# Patient Record
Sex: Male | Born: 2000 | Race: Black or African American | Hispanic: No | Marital: Single | Smoking: Never smoker
Health system: Southern US, Community
[De-identification: ages and names within clinical notes are randomized; demographics above are authoritative.]

## PROBLEM LIST (undated history)

## (undated) ENCOUNTER — Ambulatory Visit

## (undated) DIAGNOSIS — F909 Attention-deficit hyperactivity disorder, unspecified type: Secondary | ICD-10-CM

## (undated) DIAGNOSIS — J45909 Unspecified asthma, uncomplicated: Secondary | ICD-10-CM

## (undated) DIAGNOSIS — F902 Attention-deficit hyperactivity disorder, combined type: Secondary | ICD-10-CM

## (undated) HISTORY — DX: Attention-deficit hyperactivity disorder, combined type: F90.2

---
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
## 2018-02-22 ENCOUNTER — Encounter (HOSPITAL_COMMUNITY): Payer: Self-pay | Admitting: Emergency Medicine

## 2018-02-22 ENCOUNTER — Ambulatory Visit (HOSPITAL_COMMUNITY)
Admission: EM | Admit: 2018-02-22 | Discharge: 2018-02-22 | Disposition: A | Discharge status: Home / Self Care | Payer: 59 | Attending: Family Medicine | Admitting: Family Medicine

## 2018-02-22 DIAGNOSIS — J45909 Unspecified asthma, uncomplicated: Secondary | ICD-10-CM | POA: Diagnosis not present

## 2018-02-22 DIAGNOSIS — L2082 Flexural eczema: Secondary | ICD-10-CM | POA: Diagnosis not present

## 2018-02-22 DIAGNOSIS — R300 Dysuria: Secondary | ICD-10-CM

## 2018-02-22 DIAGNOSIS — F909 Attention-deficit hyperactivity disorder, unspecified type: Secondary | ICD-10-CM | POA: Diagnosis not present

## 2018-02-22 DIAGNOSIS — F913 Oppositional defiant disorder: Secondary | ICD-10-CM | POA: Diagnosis not present

## 2018-02-22 DIAGNOSIS — Z79899 Other long term (current) drug therapy: Secondary | ICD-10-CM | POA: Insufficient documentation

## 2018-02-22 LAB — POCT URINALYSIS DIP (DEVICE)
Bilirubin Urine: NEGATIVE
Glucose, UA: NEGATIVE mg/dL
Hgb urine dipstick: NEGATIVE
Ketones, ur: NEGATIVE mg/dL
Leukocytes, UA: NEGATIVE
Nitrite: NEGATIVE
Protein, ur: NEGATIVE mg/dL
Specific Gravity, Urine: 1.02 (ref 1.005–1.030)
Urobilinogen, UA: 1 mg/dL (ref 0.0–1.0)
pH: 7.5 (ref 5.0–8.0)

## 2018-02-22 MED ORDER — AZITHROMYCIN 250 MG PO TABS
1000.0000 mg | ORAL_TABLET | Freq: Once | ORAL | Status: AC
  Administered 2018-02-22: 1000 mg via ORAL

## 2018-02-22 MED ORDER — TRIAMCINOLONE ACETONIDE 0.1 % EX CREA: 1.0000 "application " | TOPICAL_CREAM | Freq: Two times a day (BID) | CUTANEOUS | 0 refills | Status: DC

## 2018-02-22 MED ORDER — AZITHROMYCIN 250 MG PO TABS
ORAL_TABLET | ORAL | Status: AC
  Filled 2018-02-22: qty 4

## 2018-02-22 NOTE — Discharge Instructions (Addendum)
We have treated you today for chlamydia, with azithromycin. Please refrain from sexual activity for 7 days while medicine is clearing infection.  We are testing you for Gonorrhea, Chlamydia and Trichomonas. We will call you if anything is positive and let you know if you require any further treatment. Please inform partner of any positive results.  Please return if symptoms not improving with treatment, development of fever, nausea, vomiting, abdominal pain, scrotal pain.  Eczema: use kenalog cream on dry areas twice daily, thin amount

## 2018-02-22 NOTE — ED Provider Notes (Signed)
[facility]    CSN: [id number] Arrival date & time: 02/22/18  1417     History   Chief Complaint Chief Complaint  Patient presents with  . Dysuria    HPI Adam Espinoza is a 17 y.o. male history of asthma presenting today for evaluation of dysuria.  Patient states that he has had a tingling sensation as well as stinging with urination beginning yesterday.  Patient was here approximately 2 months ago and tested positive for chlamydia and was having similar symptoms to begin.  He does not have any penile discharge at this time.  Patient is still sexually active.  Denies any known exposures to STDs.  Denies any fevers, nausea, vomiting, abdominal pain.  Patient also notes that he has had a rash/dryness to his bilateral flexural areas of his elbow.  As well as his neck.  States he has a history of eczema but has not broken out in a while.  HPI  Past Medical History:  Diagnosis Date  . ADHD (attention deficit hyperactivity disorder)   . ADHD (attention deficit hyperactivity disorder), combined type 01/28/2014  . Asthma     Patient Active Problem List   Diagnosis Date Noted  . ADHD (attention deficit hyperactivity disorder), combined type 01/28/2014  . ODD (oppositional defiant disorder) 01/28/2014    History reviewed. No pertinent surgical history.     Home Medications    Prior to Admission medications   Medication Sig Start Date End Date Taking? Authorizing Provider  ibuprofen (ADVIL,MOTRIN) 800 MG tablet Take 1 tablet (800 mg total) by mouth 3 (three) times daily. 08/23/17   Mardella Layman, MD  ipratropium (ATROVENT) 0.06 % nasal spray Place 2 sprays into both nostrils 4 (four) times daily. 10/30/16   Linna Hoff, MD  triamcinolone cream (KENALOG) 0.1 % Apply 1 application topically 2 (two) times daily. 02/22/18   Wieters, Junius Creamer, PA-C    Family History No family history on file.  Social History Social History   Tobacco Use  . Smoking status:  Never Smoker  . Smokeless tobacco: Never Used  Substance Use Topics  . Alcohol use: No  . Drug use: No     Allergies   Other   Review of Systems Review of Systems  Constitutional: Negative for fever.  HENT: Negative for sore throat.   Respiratory: Negative for shortness of breath.   Cardiovascular: Negative for chest pain.  Gastrointestinal: Negative for abdominal pain, nausea and vomiting.  Genitourinary: Positive for dysuria and frequency. Negative for difficulty urinating, discharge, penile pain, penile swelling, scrotal swelling and testicular pain.  Skin: Positive for rash.  Neurological: Negative for dizziness, light-headedness and headaches.     Physical Exam Triage Vital Signs ED Triage Vitals  Enc Vitals Group     BP 02/22/18 1501 128/90     Pulse Rate 02/22/18 1501 68     Resp 02/22/18 1501 16     Temp 02/22/18 1501 98.6 F (37 C)     Temp Source 02/22/18 1501 Oral     SpO2 02/22/18 1501 100 %     Weight --      Height --      Head Circumference --      Peak Flow --      Pain Score 02/22/18 1517 0     Pain Loc --      Pain Edu? --      Excl. in GC? --    No data found.  Updated Vital Signs  BP 128/90 (BP Location: Right Arm)   Pulse 68   Temp 98.6 F (37 C) (Oral)   Resp 16   SpO2 100%   Visual Acuity Right Eye Distance:   Left Eye Distance:   Bilateral Distance:    Right Eye Near:   Left Eye Near:    Bilateral Near:     Physical Exam  Constitutional: He appears well-developed and well-nourished.  HENT:  Head: Normocephalic and atraumatic.  Eyes: Conjunctivae are normal.  Neck: Neck supple.  Cardiovascular: Normal rate and regular rhythm.  No murmur heard. Pulmonary/Chest: Effort normal and breath sounds normal. No respiratory distress.  Abdominal: Soft. There is no tenderness.  Genitourinary:  Genitourinary Comments: Deferred  Musculoskeletal: He exhibits no edema.  Neurological: He is alert.  Skin: Skin is warm and dry.    Dryness with slight hyperpigmentation/erythema to bilateral Flexeril areas of elbows as well as posterior neck  Psychiatric: He has a normal mood and affect.  Nursing note and vitals reviewed.    UC Treatments / Results  Labs (all labs ordered are listed, but only abnormal results are displayed) Labs Reviewed  POCT URINALYSIS DIP (DEVICE)  URINE CYTOLOGY ANCILLARY ONLY    EKG None  Radiology No results found.  Procedures Procedures (including critical care time)  Medications Ordered in UC Medications  azithromycin (ZITHROMAX) tablet 1,000 mg (1,000 mg Oral Given 02/22/18 1538)    Initial Impression / Assessment and Plan / UC Course  I have reviewed the triage vital signs and the nursing notes.  Pertinent labs & imaging results that were available during my care of the patient were reviewed by me and considered in my medical decision making (see chart for details).     17 year old with dysuria, will empirically treat for chlamydia, declined empiric treatment for gonorrhea.  Urine cytology sent, will call with any positive results and modify treatment as needed.  Patient also with Flexeril eczema, will provide Kenalog cream to apply to problematic areas, discussed moisturizing measures at home. Discussed strict return precautions. Patient verbalized understanding and is agreeable with plan.  Final Clinical Impressions(s) / UC Diagnoses   Final diagnoses:  Dysuria  Flexural eczema     Discharge Instructions     We have treated you today for chlamydia, with azithromycin. Please refrain from sexual activity for 7 days while medicine is clearing infection.  We are testing you for Gonorrhea, Chlamydia and Trichomonas. We will call you if anything is positive and let you know if you require any further treatment. Please inform partner of any positive results.  Please return if symptoms not improving with treatment, development of fever, nausea, vomiting, abdominal pain,  scrotal pain.  Eczema: use kenalog cream on dry areas twice daily, thin amount   ED Prescriptions    Medication Sig Dispense Auth. Provider   triamcinolone cream (KENALOG) 0.1 % Apply 1 application topically 2 (two) times daily. 45 g Wieters, [place] C, PA-C     Controlled Substance Prescriptions [place] Controlled Substance Registry consulted? Not Applicable   Lew Dawes, [place] 02/22/18 1545

## 2018-02-22 NOTE — ED Triage Notes (Signed)
Pt states he was recently treated for chlamydia and symptmos went away, but now it stings when he pees.

## 2018-02-23 LAB — URINE CYTOLOGY ANCILLARY ONLY
Chlamydia: NEGATIVE
Neisseria Gonorrhea: NEGATIVE
Trichomonas: NEGATIVE

## 2018-02-24 NOTE — Progress Notes (Signed)
All std's are negative. Attempted to reach patient with no answer or voicemail set up.

## 2018-04-16 ENCOUNTER — Encounter (HOSPITAL_COMMUNITY): Payer: Self-pay | Admitting: Emergency Medicine

## 2018-04-16 ENCOUNTER — Emergency Department (HOSPITAL_COMMUNITY)
Admission: EM | Admit: 2018-04-16 | Discharge: 2018-04-16 | Disposition: A | Discharge status: Home / Self Care | Payer: 59 | Attending: Emergency Medicine | Admitting: Emergency Medicine

## 2018-04-16 ENCOUNTER — Emergency Department (HOSPITAL_COMMUNITY): Payer: 59

## 2018-04-16 ENCOUNTER — Other Ambulatory Visit: Payer: Self-pay

## 2018-04-16 DIAGNOSIS — S8012XA Contusion of left lower leg, initial encounter: Secondary | ICD-10-CM | POA: Diagnosis not present

## 2018-04-16 DIAGNOSIS — Z79899 Other long term (current) drug therapy: Secondary | ICD-10-CM | POA: Diagnosis not present

## 2018-04-16 DIAGNOSIS — S161XXA Strain of muscle, fascia and tendon at neck level, initial encounter: Secondary | ICD-10-CM | POA: Insufficient documentation

## 2018-04-16 DIAGNOSIS — Y999 Unspecified external cause status: Secondary | ICD-10-CM | POA: Insufficient documentation

## 2018-04-16 DIAGNOSIS — Y9389 Activity, other specified: Secondary | ICD-10-CM | POA: Diagnosis not present

## 2018-04-16 DIAGNOSIS — S8011XA Contusion of right lower leg, initial encounter: Secondary | ICD-10-CM | POA: Diagnosis not present

## 2018-04-16 DIAGNOSIS — S6991XA Unspecified injury of right wrist, hand and finger(s), initial encounter: Secondary | ICD-10-CM | POA: Diagnosis not present

## 2018-04-16 DIAGNOSIS — S3993XA Unspecified injury of pelvis, initial encounter: Secondary | ICD-10-CM | POA: Diagnosis not present

## 2018-04-16 DIAGNOSIS — S298XXA Other specified injuries of thorax, initial encounter: Secondary | ICD-10-CM | POA: Diagnosis not present

## 2018-04-16 DIAGNOSIS — T07XXXA Unspecified multiple injuries, initial encounter: Secondary | ICD-10-CM | POA: Insufficient documentation

## 2018-04-16 DIAGNOSIS — S8991XA Unspecified injury of right lower leg, initial encounter: Secondary | ICD-10-CM | POA: Diagnosis not present

## 2018-04-16 DIAGNOSIS — Y92410 Unspecified street and highway as the place of occurrence of the external cause: Secondary | ICD-10-CM | POA: Diagnosis not present

## 2018-04-16 DIAGNOSIS — S299XXA Unspecified injury of thorax, initial encounter: Secondary | ICD-10-CM | POA: Diagnosis not present

## 2018-04-16 DIAGNOSIS — S199XXA Unspecified injury of neck, initial encounter: Secondary | ICD-10-CM | POA: Diagnosis not present

## 2018-04-16 DIAGNOSIS — M7989 Other specified soft tissue disorders: Secondary | ICD-10-CM | POA: Diagnosis not present

## 2018-04-16 DIAGNOSIS — S060X0A Concussion without loss of consciousness, initial encounter: Secondary | ICD-10-CM | POA: Diagnosis not present

## 2018-04-16 DIAGNOSIS — S0990XA Unspecified injury of head, initial encounter: Secondary | ICD-10-CM | POA: Diagnosis not present

## 2018-04-16 DIAGNOSIS — Z23 Encounter for immunization: Secondary | ICD-10-CM | POA: Insufficient documentation

## 2018-04-16 DIAGNOSIS — S6992XA Unspecified injury of left wrist, hand and finger(s), initial encounter: Secondary | ICD-10-CM | POA: Diagnosis not present

## 2018-04-16 DIAGNOSIS — M79604 Pain in right leg: Secondary | ICD-10-CM | POA: Diagnosis not present

## 2018-04-16 DIAGNOSIS — S3991XA Unspecified injury of abdomen, initial encounter: Secondary | ICD-10-CM | POA: Diagnosis not present

## 2018-04-16 LAB — I-STAT CHEM 8, ED
BUN: 10 mg/dL (ref 6–20)
Calcium, Ion: 1.12 mmol/L — ABNORMAL LOW (ref 1.15–1.40)
Chloride: 105 mmol/L (ref 98–111)
Creatinine, Ser: 1.2 mg/dL (ref 0.61–1.24)
Glucose, Bld: 111 mg/dL — ABNORMAL HIGH (ref 70–99)
HCT: 42 % (ref 39.0–52.0)
Hemoglobin: 14.3 g/dL (ref 13.0–17.0)
Potassium: 3.3 mmol/L — ABNORMAL LOW (ref 3.5–5.1)
Sodium: 144 mmol/L (ref 135–145)
TCO2: 23 mmol/L (ref 22–32)

## 2018-04-16 LAB — COMPREHENSIVE METABOLIC PANEL
ALT: 14 U/L (ref 0–44)
AST: 25 U/L (ref 15–41)
Albumin: 4.1 g/dL (ref 3.5–5.0)
Alkaline Phosphatase: 87 U/L (ref 38–126)
Anion gap: 15 (ref 5–15)
BUN: 9 mg/dL (ref 6–20)
CO2: 24 mmol/L (ref 22–32)
Calcium: 9.4 mg/dL (ref 8.9–10.3)
Chloride: 105 mmol/L (ref 98–111)
Creatinine, Ser: 1.16 mg/dL (ref 0.61–1.24)
GFR calc Af Amer: >60 mL/min (ref >60)
GFR calc non Af Amer: >60 mL/min (ref >60)
Glucose, Bld: 112 mg/dL — ABNORMAL HIGH (ref 70–99)
Potassium: 3.3 mmol/L — ABNORMAL LOW (ref 3.5–5.1)
Sodium: 144 mmol/L (ref 135–145)
Total Bilirubin: 0.7 mg/dL (ref 0.3–1.2)
Total Protein: 6.3 g/dL — ABNORMAL LOW (ref 6.5–8.1)

## 2018-04-16 LAB — PROTIME-INR
INR: 1.05
Prothrombin Time: 13.6 seconds (ref 11.4–15.2)

## 2018-04-16 LAB — CBC
HCT: 43.8 % (ref 39.0–52.0)
Hemoglobin: 14.8 g/dL (ref 13.0–17.0)
MCH: 29.6 pg (ref 26.0–34.0)
MCHC: 33.8 g/dL (ref 30.0–36.0)
MCV: 87.6 fL (ref 78.0–100.0)
Platelets: 344 10*3/uL (ref 150–400)
RBC: 5 MIL/uL (ref 4.22–5.81)
RDW: 12.3 % (ref 11.5–15.5)
WBC: 8.9 10*3/uL (ref 4.0–10.5)

## 2018-04-16 LAB — I-STAT CG4 LACTIC ACID, ED
Lactic Acid, Venous: 1.18 mmol/L (ref 0.5–1.9)
Lactic Acid, Venous: 5.79 mmol/L (ref 0.5–1.9)

## 2018-04-16 LAB — ETHANOL: Alcohol, Ethyl (B): 57 mg/dL — ABNORMAL HIGH (ref <10)

## 2018-04-16 MED ORDER — HYDROCODONE-ACETAMINOPHEN 5-325 MG PO TABS: 1.0000 | ORAL_TABLET | Freq: Four times a day (QID) | ORAL | 0 refills | Status: DC | PRN

## 2018-04-16 MED ORDER — SODIUM CHLORIDE 0.9 % IV SOLN
INTRAVENOUS | Status: AC | PRN
  Administered 2018-04-16: 1000 mL via INTRAVENOUS

## 2018-04-16 MED ORDER — IBUPROFEN 400 MG PO TABS: 400.0000 mg | ORAL_TABLET | Freq: Three times a day (TID) | ORAL | 0 refills | Status: DC | PRN

## 2018-04-16 MED ORDER — ONDANSETRON HCL 4 MG/2ML IJ SOLN
INTRAMUSCULAR | Status: AC | PRN
  Administered 2018-04-16: 4 mg via INTRAVENOUS

## 2018-04-16 MED ORDER — IOHEXOL 300 MG/ML  SOLN
100.0000 mL | Freq: Once | INTRAMUSCULAR | Status: AC | PRN
  Administered 2018-04-16: 100 mL via INTRAVENOUS

## 2018-04-16 MED ORDER — FENTANYL CITRATE (PF) 100 MCG/2ML IJ SOLN
INTRAMUSCULAR | Status: AC | PRN
  Administered 2018-04-16: 50 ug via INTRAVENOUS

## 2018-04-16 MED ORDER — FENTANYL CITRATE (PF) 100 MCG/2ML IJ SOLN
100.0000 ug | Freq: Once | INTRAMUSCULAR | Status: AC
  Administered 2018-04-16: 100 ug via INTRAVENOUS

## 2018-04-16 MED ORDER — SODIUM CHLORIDE 0.9 % IV BOLUS (SEPSIS)
1000.0000 mL | Freq: Once | INTRAVENOUS | Status: AC
  Administered 2018-04-16: 1000 mL via INTRAVENOUS

## 2018-04-16 MED ORDER — TETANUS-DIPHTH-ACELL PERTUSSIS 5-2.5-18.5 LF-MCG/0.5 IM SUSP
0.5000 mL | Freq: Once | INTRAMUSCULAR | Status: AC
  Administered 2018-04-16: 0.5 mL via INTRAMUSCULAR
  Filled 2018-04-16: qty 0.5

## 2018-04-16 MED ORDER — FENTANYL CITRATE (PF) 100 MCG/2ML IJ SOLN
INTRAMUSCULAR | Status: AC | PRN
  Administered 2018-04-16: 100 ug via INTRAVENOUS

## 2018-04-16 MED ORDER — ONDANSETRON HCL 4 MG/2ML IJ SOLN
INTRAMUSCULAR | Status: AC
  Filled 2018-04-16: qty 2

## 2018-04-16 MED ORDER — FENTANYL CITRATE (PF) 100 MCG/2ML IJ SOLN
INTRAMUSCULAR | Status: AC
  Filled 2018-04-16: qty 2

## 2018-04-16 MED ORDER — SODIUM CHLORIDE 0.9 % IV BOLUS (SEPSIS): 1000.0000 mL | Freq: Once | INTRAVENOUS | Status: DC
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
## 2018-04-16 NOTE — ED Notes (Signed)
To CT with RN.

## 2018-04-21 ENCOUNTER — Encounter (HOSPITAL_COMMUNITY): Payer: Self-pay | Admitting: Emergency Medicine

## 2018-04-21 ENCOUNTER — Emergency Department (HOSPITAL_COMMUNITY)
Admission: EM | Admit: 2018-04-21 | Discharge: 2018-04-21 | Disposition: A | Discharge status: Home / Self Care | Payer: 59 | Attending: Emergency Medicine | Admitting: Emergency Medicine

## 2018-04-21 DIAGNOSIS — Y33XXXD Other specified events, undetermined intent, subsequent encounter: Secondary | ICD-10-CM | POA: Diagnosis not present

## 2018-04-21 DIAGNOSIS — T148XXD Other injury of unspecified body region, subsequent encounter: Secondary | ICD-10-CM | POA: Insufficient documentation

## 2018-04-21 DIAGNOSIS — S60512A Abrasion of left hand, initial encounter: Secondary | ICD-10-CM | POA: Diagnosis not present

## 2018-04-21 DIAGNOSIS — S40811A Abrasion of right upper arm, initial encounter: Secondary | ICD-10-CM | POA: Diagnosis not present

## 2018-04-21 DIAGNOSIS — S60511A Abrasion of right hand, initial encounter: Secondary | ICD-10-CM | POA: Diagnosis not present

## 2018-04-21 DIAGNOSIS — S40812A Abrasion of left upper arm, initial encounter: Secondary | ICD-10-CM | POA: Diagnosis not present

## 2018-04-21 DIAGNOSIS — Z5189 Encounter for other specified aftercare: Secondary | ICD-10-CM

## 2018-04-21 MED ORDER — HYDROCODONE-ACETAMINOPHEN 5-325 MG PO TABS: 1.0000 | ORAL_TABLET | Freq: Four times a day (QID) | ORAL | 0 refills | Status: DC | PRN

## 2018-04-21 MED ORDER — METHOCARBAMOL 750 MG PO TABS: 750.0000 mg | ORAL_TABLET | Freq: Four times a day (QID) | ORAL | 0 refills | Status: DC

## 2018-04-21 NOTE — ED Notes (Signed)
Pt and family provided with drink

## 2018-04-21 NOTE — ED Notes (Signed)
MD at bedside. 
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
## 2018-04-21 NOTE — ED Notes (Signed)
Per pt: MD advised to leave wounds undressed and open to air.

## 2018-12-08 DIAGNOSIS — G729 Myopathy, unspecified: Secondary | ICD-10-CM | POA: Insufficient documentation

## 2018-12-08 DIAGNOSIS — F17210 Nicotine dependence, cigarettes, uncomplicated: Secondary | ICD-10-CM | POA: Diagnosis not present

## 2018-12-08 DIAGNOSIS — Z7689 Persons encountering health services in other specified circumstances: Secondary | ICD-10-CM | POA: Insufficient documentation

## 2019-06-07 ENCOUNTER — Other Ambulatory Visit: Payer: Self-pay

## 2019-06-07 ENCOUNTER — Encounter (HOSPITAL_COMMUNITY): Payer: Self-pay

## 2019-06-07 ENCOUNTER — Emergency Department (HOSPITAL_COMMUNITY)
Admission: EM | Admit: 2019-06-07 | Discharge: 2019-06-07 | Disposition: A | Discharge status: Home / Self Care | Payer: 59 | Attending: Emergency Medicine | Admitting: Emergency Medicine

## 2019-06-07 DIAGNOSIS — R109 Unspecified abdominal pain: Secondary | ICD-10-CM | POA: Diagnosis present

## 2019-06-07 DIAGNOSIS — R1110 Vomiting, unspecified: Secondary | ICD-10-CM | POA: Insufficient documentation

## 2019-06-07 DIAGNOSIS — Z5321 Procedure and treatment not carried out due to patient leaving prior to being seen by health care provider: Secondary | ICD-10-CM | POA: Insufficient documentation

## 2019-06-07 DIAGNOSIS — R197 Diarrhea, unspecified: Secondary | ICD-10-CM | POA: Insufficient documentation

## 2019-06-07 LAB — CBC
HCT: 45.2 % (ref 39.0–52.0)
Hemoglobin: 15.1 g/dL (ref 13.0–17.0)
MCH: 29.3 pg (ref 26.0–34.0)
MCHC: 33.4 g/dL (ref 30.0–36.0)
MCV: 87.8 fL (ref 80.0–100.0)
Platelets: 281 10*3/uL (ref 150–400)
RBC: 5.15 MIL/uL (ref 4.22–5.81)
RDW: 12.4 % (ref 11.5–15.5)
WBC: 8.4 10*3/uL (ref 4.0–10.5)
nRBC: 0 % (ref 0.0–0.2)

## 2019-06-07 LAB — COMPREHENSIVE METABOLIC PANEL
ALT: 16 U/L (ref 0–44)
AST: 20 U/L (ref 15–41)
Albumin: 5 g/dL (ref 3.5–5.0)
Alkaline Phosphatase: 95 U/L (ref 38–126)
Anion gap: 8 (ref 5–15)
BUN: 11 mg/dL (ref 6–20)
CO2: 26 mmol/L (ref 22–32)
Calcium: 9.2 mg/dL (ref 8.9–10.3)
Chloride: 103 mmol/L (ref 98–111)
Creatinine, Ser: 0.86 mg/dL (ref 0.61–1.24)
GFR calc Af Amer: >60 mL/min (ref >60)
GFR calc non Af Amer: >60 mL/min (ref >60)
Glucose, Bld: 96 mg/dL (ref 70–99)
Potassium: 3.8 mmol/L (ref 3.5–5.1)
Sodium: 137 mmol/L (ref 135–145)
Total Bilirubin: 1.5 mg/dL — ABNORMAL HIGH (ref 0.3–1.2)
Total Protein: 8 g/dL (ref 6.5–8.1)

## 2019-06-07 LAB — LIPASE, BLOOD: Lipase: 29 U/L (ref 11–51)

## 2019-06-07 MED ORDER — SODIUM CHLORIDE 0.9% FLUSH: 3.0000 mL | Freq: Once | INTRAVENOUS | Status: DC

## 2019-06-07 NOTE — ED Triage Notes (Signed)
Pt states that he has been having abd pain since yesterday. Pt states that he couldn't sleep last night due to emesis and diarrhea every 30 minutes.

## 2020-05-21 ENCOUNTER — Emergency Department (HOSPITAL_COMMUNITY)
Admission: EM | Admit: 2020-05-21 | Discharge: 2020-05-21 | Disposition: A | Discharge status: Home / Self Care | Payer: 59 | Attending: Emergency Medicine | Admitting: Emergency Medicine

## 2020-05-21 ENCOUNTER — Encounter (HOSPITAL_COMMUNITY): Payer: Self-pay | Admitting: Emergency Medicine

## 2020-05-21 ENCOUNTER — Other Ambulatory Visit: Payer: Self-pay

## 2020-05-21 DIAGNOSIS — S01411A Laceration without foreign body of right cheek and temporomandibular area, initial encounter: Secondary | ICD-10-CM | POA: Diagnosis not present

## 2020-05-21 DIAGNOSIS — J45909 Unspecified asthma, uncomplicated: Secondary | ICD-10-CM | POA: Diagnosis not present

## 2020-05-21 DIAGNOSIS — Z23 Encounter for immunization: Secondary | ICD-10-CM | POA: Diagnosis not present

## 2020-05-21 DIAGNOSIS — W25XXXA Contact with sharp glass, initial encounter: Secondary | ICD-10-CM | POA: Diagnosis not present

## 2020-05-21 DIAGNOSIS — F909 Attention-deficit hyperactivity disorder, unspecified type: Secondary | ICD-10-CM | POA: Diagnosis not present

## 2020-05-21 DIAGNOSIS — S0181XA Laceration without foreign body of other part of head, initial encounter: Secondary | ICD-10-CM | POA: Insufficient documentation

## 2020-05-21 DIAGNOSIS — Y929 Unspecified place or not applicable: Secondary | ICD-10-CM | POA: Diagnosis not present

## 2020-05-21 DIAGNOSIS — Y999 Unspecified external cause status: Secondary | ICD-10-CM | POA: Diagnosis not present

## 2020-05-21 DIAGNOSIS — Y939 Activity, unspecified: Secondary | ICD-10-CM | POA: Diagnosis not present

## 2020-05-21 MED ORDER — TETANUS-DIPHTH-ACELL PERTUSSIS 5-2.5-18.5 LF-MCG/0.5 IM SUSP
0.5000 mL | Freq: Once | INTRAMUSCULAR | Status: AC
  Administered 2020-05-21: 0.5 mL via INTRAMUSCULAR
  Filled 2020-05-21: qty 0.5

## 2020-05-21 MED ORDER — LIDOCAINE-EPINEPHRINE (PF) 2 %-1:200000 IJ SOLN
20.0000 mL | Freq: Once | INTRAMUSCULAR | Status: AC
  Administered 2020-05-21: 20 mL
  Filled 2020-05-21: qty 20
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
## 2020-05-30 ENCOUNTER — Ambulatory Visit (HOSPITAL_COMMUNITY)
Admission: EM | Admit: 2020-05-30 | Discharge: 2020-05-30 | Disposition: A | Discharge status: Home / Self Care | Payer: 59

## 2020-05-30 DIAGNOSIS — Z4802 Encounter for removal of sutures: Secondary | ICD-10-CM

## 2020-05-30 NOTE — ED Triage Notes (Signed)
Patient for suture removal.  Sutures placed on 05/21/2020

## 2021-05-25 ENCOUNTER — Other Ambulatory Visit (HOSPITAL_COMMUNITY): Payer: Self-pay

## 2021-05-25 MED ORDER — CARESTART COVID-19 HOME TEST VI KIT
PACK | 0 refills | Status: AC
  Filled 2021-05-25: qty 4, 4d supply, fill #0

## 2021-08-14 ENCOUNTER — Other Ambulatory Visit: Payer: Self-pay

## 2021-08-14 ENCOUNTER — Ambulatory Visit
Admission: EM | Admit: 2021-08-14 | Discharge: 2021-08-14 | Disposition: A | Discharge status: Home / Self Care | Payer: 59 | Attending: Physician Assistant | Admitting: Physician Assistant

## 2021-08-14 ENCOUNTER — Ambulatory Visit (INDEPENDENT_AMBULATORY_CARE_PROVIDER_SITE_OTHER): Payer: 59

## 2021-08-14 DIAGNOSIS — M25511 Pain in right shoulder: Secondary | ICD-10-CM | POA: Diagnosis not present

## 2021-08-14 MED ORDER — CYCLOBENZAPRINE HCL 10 MG PO TABS
10.0000 mg | ORAL_TABLET | Freq: Two times a day (BID) | ORAL | 0 refills | Status: DC | PRN
Start: 2021-08-14 — End: 2022-05-19

## 2021-08-14 MED ORDER — PREDNISONE 20 MG PO TABS: 40.0000 mg | ORAL_TABLET | Freq: Every day | ORAL | 0 refills | Status: AC
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
## 2021-08-14 NOTE — Discharge Instructions (Addendum)
No fracture on xray! Use muscle relaxer with caution. Follow up with any further concerns.

## 2021-08-14 NOTE — ED Triage Notes (Signed)
Pt c/o general body pain, more localized pain being right shoulder, and pain in right lumbar area that is sharp.

## 2022-05-19 ENCOUNTER — Ambulatory Visit
Admission: EM | Admit: 2022-05-19 | Discharge: 2022-05-19 | Disposition: A | Discharge status: Home / Self Care | Payer: 59 | Attending: Physician Assistant | Admitting: Physician Assistant

## 2022-05-19 DIAGNOSIS — M62838 Other muscle spasm: Secondary | ICD-10-CM | POA: Diagnosis not present

## 2022-05-19 LAB — POCT URINALYSIS DIP (MANUAL ENTRY)
Bilirubin, UA: NEGATIVE
Blood, UA: NEGATIVE
Glucose, UA: NEGATIVE mg/dL
Ketones, POC UA: NEGATIVE mg/dL
Leukocytes, UA: NEGATIVE
Nitrite, UA: NEGATIVE
Protein Ur, POC: NEGATIVE mg/dL
Spec Grav, UA: >=1.03 — AB (ref 1.010–1.025)
Urobilinogen, UA: 1 E.U./dL
pH, UA: 6 (ref 5.0–8.0)

## 2022-05-19 MED ORDER — CYCLOBENZAPRINE HCL 10 MG PO TABS: 10.0000 mg | ORAL_TABLET | Freq: Three times a day (TID) | ORAL | 0 refills | Status: AC | PRN

## 2022-05-19 MED ORDER — METHOCARBAMOL 750 MG PO TABS: 750.0000 mg | ORAL_TABLET | Freq: Four times a day (QID) | ORAL | 0 refills | Status: DC

## 2022-05-19 NOTE — ED Provider Notes (Signed)
[facility]    CSN: [id number] Arrival date & time: 05/19/22  1328      History   Chief Complaint Chief Complaint  Patient presents with   Flank Pain   Dysuria    HPI Adam Espinoza is a 21 y.o. male.   Pt complains of left lower back pain that started about three days ago.  He reports pain is worse with movement and he has experienced some left back pain when urinating.  He denies dysuria, hematuria, penile discharge, fever, chills, n/v/d.  He has taken nothing for the pain.     Past Medical History:  Diagnosis Date   ADHD (attention deficit hyperactivity disorder)    ADHD (attention deficit hyperactivity disorder), combined type 01/28/2014   Asthma     Patient Active Problem List   Diagnosis Date Noted   ADHD (attention deficit hyperactivity disorder), combined type 01/28/2014   ODD (oppositional defiant disorder) 01/28/2014    No past surgical history on file.     Home Medications    Prior to Admission medications   Medication Sig Start Date End Date Taking? Authorizing Provider  cyclobenzaprine (FLEXERIL) 10 MG tablet Take 1 tablet (10 mg total) by mouth 3 (three) times daily as needed for up to 7 days for muscle spasms. 05/19/22 05/26/22 Yes Ward, Tylene Fantasia, PA-C  acetaminophen (TYLENOL) 325 MG tablet Take 325-650 mg by mouth every 6 (six) hours as needed (for pain or headaches).    [provider]  COVID-19 At Home Antigen Test [facility] COVID-19 HOME TEST) KIT Use as Directed 05/25/21   Driscilla Grammes, [facility]  HYDROcodone-acetaminophen (NORCO/VICODIN) 5-325 MG tablet Take 1 tablet by mouth every 6 (six) hours as needed for severe pain. 04/16/18   Zadie Rhine, MD  HYDROcodone-acetaminophen (NORCO/VICODIN) 5-325 MG tablet Take 1-2 tablets by mouth every 6 (six) hours as needed. 04/21/18   Lorre Nick, MD  ibuprofen (ADVIL,MOTRIN) 400 MG tablet Take 1 tablet (400 mg total) by mouth every 8 (eight) hours as needed for moderate  pain. Patient not taking: Reported on 04/21/2018 04/16/18   Zadie Rhine, MD    Family History No family history on file.  Social History Social History   Tobacco Use   Smoking status: Never   Smokeless tobacco: Never  Substance Use Topics   Alcohol use: Yes   Drug use: No     Allergies   Nickel and Other   Review of Systems Review of Systems  Constitutional:  Negative for chills and fever.  HENT:  Negative for ear pain and sore throat.   Eyes:  Negative for pain and visual disturbance.  Respiratory:  Negative for cough and shortness of breath.   Cardiovascular:  Negative for chest pain and palpitations.  Gastrointestinal:  Negative for abdominal pain and vomiting.  Genitourinary:  Negative for dysuria and hematuria.  Musculoskeletal:  Positive for back pain. Negative for arthralgias.  Skin:  Negative for color change and rash.  Neurological:  Negative for seizures and syncope.  All other systems reviewed and are negative.    Physical Exam Triage Vital Signs ED Triage Vitals  Enc Vitals Group     BP 05/19/22 1359 124/80     Pulse Rate 05/19/22 1359 77     Resp 05/19/22 1359 20     Temp 05/19/22 1359 98.3 F (36.8 C)     Temp src --      SpO2 05/19/22 1359 98 %     Weight --  Height --      Head Circumference --      Peak Flow --      Pain Score 05/19/22 1358 0     Pain Loc --      Pain Edu? --      Excl. in [place]? --    No data found.  Updated Vital Signs BP 124/80   Pulse 77   Temp 98.3 F (36.8 C)   Resp 20   SpO2 98%   Visual Acuity Right Eye Distance:   Left Eye Distance:   Bilateral Distance:    Right Eye Near:   Left Eye Near:    Bilateral Near:     Physical Exam Vitals and nursing note reviewed.  Constitutional:      General: He is not in acute distress.    Appearance: He is well-developed.  HENT:     Head: Normocephalic and atraumatic.  Eyes:     Conjunctiva/sclera: Conjunctivae normal.  Cardiovascular:     Rate and  Rhythm: Normal rate and regular rhythm.     Heart sounds: No murmur heard. Pulmonary:     Effort: Pulmonary effort is normal. No respiratory distress.     Breath sounds: Normal breath sounds.  Abdominal:     Palpations: Abdomen is soft.     Tenderness: There is no abdominal tenderness.  Musculoskeletal:        General: No swelling.     Cervical back: Neck supple.     Comments: Left lumbar paraspinal musculature TTP with spasm.  No CVA tenderness.   Skin:    General: Skin is warm and dry.     Capillary Refill: Capillary refill takes less than 2 seconds.  Neurological:     Mental Status: He is alert.  Psychiatric:        Mood and Affect: Mood normal.      UC Treatments / Results  Labs (all labs ordered are listed, but only abnormal results are displayed) Labs Reviewed  POCT URINALYSIS DIP (MANUAL ENTRY) - Abnormal; Notable for the following components:      Result Value   Spec Grav, UA >=1.030 (*)    All other components within normal limits    EKG   Radiology No results found.  Procedures Procedures (including critical care time)  Medications Ordered in UC Medications - No data to display  Initial Impression / Assessment and Plan / UC Course  I have reviewed the triage vital signs and the nursing notes.  Pertinent labs & imaging results that were available during my care of the patient were reviewed by me and considered in my medical decision making (see chart for details).     Muscle spasm.  No dysuria, no CVA tenderness, UA with no signs of infection.  Flexeril prescribed.  Supportive care discussed.  Return precautions discussed.  Final Clinical Impressions(s) / UC Diagnoses   Final diagnoses:  Muscle spasm     Discharge Instructions      Take flexeril as needed for muscle spasm If you develop worsening sx return for evaluation  Drink plenty of fluids    ED Prescriptions     Medication Sig Dispense Auth. Provider   cyclobenzaprine (FLEXERIL) 10  MG tablet Take 1 tablet (10 mg total) by mouth 3 (three) times daily as needed for up to 7 days for muscle spasms. 21 tablet Ward, Lenise Arena, PA-C   methocarbamol (ROBAXIN-750) 750 MG tablet  (Status: Discontinued) Take 1 tablet (750 mg total) by mouth  4 (four) times daily. 30 tablet Ward, Lenise Arena, PA-C      PDMP not reviewed this encounter.   Ward, Lenise Arena, PA-C 05/19/22 1423

## 2022-05-19 NOTE — ED Triage Notes (Signed)
Pt presents to uc with co of flank pain and dysuria L sided for 3 days. Pt reports no concern for sti no discharge or rash

## 2022-05-19 NOTE — Discharge Instructions (Addendum)
Take flexeril as needed for muscle spasm If you develop worsening sx return for evaluation  Drink plenty of fluids
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
## 2023-02-02 ENCOUNTER — Emergency Department (HOSPITAL_BASED_OUTPATIENT_CLINIC_OR_DEPARTMENT_OTHER)
Admission: EM | Admit: 2023-02-02 | Discharge: 2023-02-02 | Disposition: A | Discharge status: Home / Self Care | Payer: Commercial Managed Care - PPO | Attending: Emergency Medicine | Admitting: Emergency Medicine

## 2023-02-02 ENCOUNTER — Encounter (HOSPITAL_BASED_OUTPATIENT_CLINIC_OR_DEPARTMENT_OTHER): Payer: Self-pay

## 2023-02-02 DIAGNOSIS — R300 Dysuria: Secondary | ICD-10-CM | POA: Insufficient documentation

## 2023-02-02 DIAGNOSIS — H5789 Other specified disorders of eye and adnexa: Secondary | ICD-10-CM | POA: Insufficient documentation

## 2023-02-02 LAB — URINALYSIS, ROUTINE W REFLEX MICROSCOPIC
Bilirubin Urine: NEGATIVE
Glucose, UA: NEGATIVE mg/dL
Ketones, ur: NEGATIVE mg/dL
Nitrite: NEGATIVE
Protein, ur: NEGATIVE mg/dL
Specific Gravity, Urine: 1.025 (ref 1.005–1.030)
pH: 6 (ref 5.0–8.0)

## 2023-02-02 LAB — URINALYSIS, MICROSCOPIC (REFLEX)

## 2023-02-02 MED ORDER — ERYTHROMYCIN 5 MG/GM OP OINT: TOPICAL_OINTMENT | OPHTHALMIC | 0 refills | Status: DC

## 2023-02-02 MED ORDER — DOXYCYCLINE HYCLATE 100 MG PO CAPS: 100.0000 mg | ORAL_CAPSULE | Freq: Two times a day (BID) | ORAL | 0 refills | Status: DC

## 2023-02-02 MED ORDER — LIDOCAINE HCL (PF) 1 % IJ SOLN
INTRAMUSCULAR | Status: AC
  Filled 2023-02-02: qty 5

## 2023-02-02 MED ORDER — DOXYCYCLINE HYCLATE 100 MG PO TABS
100.0000 mg | ORAL_TABLET | Freq: Once | ORAL | Status: AC
  Administered 2023-02-02: 100 mg via ORAL
  Filled 2023-02-02: qty 1

## 2023-02-02 MED ORDER — FLUORESCEIN SODIUM 1 MG OP STRP
1.0000 | ORAL_STRIP | Freq: Once | OPHTHALMIC | Status: AC
  Administered 2023-02-02: 1 via OPHTHALMIC
  Filled 2023-02-02: qty 1

## 2023-02-02 MED ORDER — CEFTRIAXONE SODIUM 500 MG IJ SOLR
500.0000 mg | Freq: Once | INTRAMUSCULAR | Status: AC
  Administered 2023-02-02: 500 mg via INTRAMUSCULAR
  Filled 2023-02-02: qty 500

## 2023-02-02 NOTE — Discharge Instructions (Addendum)
As discussed in the room we have started you on multiple antibiotics.  Erythromycin eye ointment for possible pinkeye. You will need to call an ophthalmologist tomorrow to get in to be seen over the next 1-2 days.  Dr. Vanessa Barbara is the on-call ophthalmologist.  Please call his office tomorrow to schedule an appointment.  Let them know you are seen in the emergency department and needed close follow-up  We have also treated you for possible STD or urinary tract infection.  You will be called if your STD testing is positive.  Abstain from sexual intercourse until your test have resulted.  If positive you need to let your partners know so they may be tested and treated as well.  If your testing is positive you will need to abstain from intercourse for at least 1 week  Follow-up with a primary care doctor  Return for new or worsening symptoms

## 2023-02-02 NOTE — ED Provider Notes (Signed)
[place] [facility] Provider Note   CSN: [id number] Arrival date & time: 02/02/23  2200     History  Chief Complaint  Patient presents with   Eye Pain    Adam Espinoza is a 22 y.o. male with no significant past medical history here for evaluation of several complaints.  Has noted some dysuria as well as discharge from his penis.  No lesion.  Sexually active.  History of prior chlamydia.  No abdominal pain, pain with bowel movements, testicular pain.  Also noted over the last 3 days he has had itchy, clear watery drainage from his left eye.  He does not wear contacts.  He is not followed by ophthalmology.  Denies any chance at bodily fluid/STD in his eye.  No recent trauma or injury.  No blurred vision.  No pain with eye movements.  No swelling or redness to the face or surrounding orbits.  HPI     Home Medications Prior to Admission medications   Medication Sig Start Date End Date Taking? Authorizing Provider  doxycycline (VIBRAMYCIN) 100 MG capsule Take 1 capsule (100 mg total) by mouth 2 (two) times daily. 02/02/23  Yes Erasto Sleight A, PA-C  erythromycin ophthalmic ointment Place a 1/2 inch ribbon of ointment into the lower eyelid. 02/02/23  Yes Josselyn Harkins A, PA-C  acetaminophen (TYLENOL) 325 MG tablet Take 325-650 mg by mouth every 6 (six) hours as needed (for pain or headaches).    [provider]  COVID-19 At Home Antigen Test [facility] COVID-19 HOME TEST) KIT Use as Directed 05/25/21   Driscilla Grammes, [facility]  HYDROcodone-acetaminophen (NORCO/VICODIN) 5-325 MG tablet Take 1 tablet by mouth every 6 (six) hours as needed for severe pain. 04/16/18   Zadie Rhine, MD  HYDROcodone-acetaminophen (NORCO/VICODIN) 5-325 MG tablet Take 1-2 tablets by mouth every 6 (six) hours as needed. 04/21/18   Lorre Nick, MD  ibuprofen (ADVIL,MOTRIN) 400 MG tablet Take 1 tablet (400 mg total) by mouth every 8 (eight) hours as needed  for moderate pain. Patient not taking: Reported on 04/21/2018 04/16/18   Zadie Rhine, MD      Allergies    Nickel and Other    Review of Systems   Review of Systems  Constitutional: Negative.   HENT: Negative.    Eyes:  Positive for pain, discharge, redness and itching.  Respiratory: Negative.    Cardiovascular: Negative.   Gastrointestinal: Negative.   Genitourinary:  Positive for dysuria and penile discharge.  Musculoskeletal: Negative.   Skin: Negative.   Neurological: Negative.   All other systems reviewed and are negative.   Physical Exam Updated Vital Signs BP 138/78 (BP Location: Left Arm)   Pulse 90   Temp 98.2 F (36.8 C) (Oral)   Resp 18   Ht  (1.676 m)   Wt 94.3 kg   SpO2 100%   BMI 33.57 kg/m  Physical Exam Vitals and nursing note reviewed.  Constitutional:      General: He is not in acute distress.    Appearance: He is well-developed. He is not ill-appearing, toxic-appearing or diaphoretic.  HENT:     Head: Atraumatic.  Eyes:     General: Vision grossly intact.        Right eye: No foreign body, discharge or hordeolum.        Left eye: No foreign body, discharge or hordeolum.     Extraocular Movements: Extraocular movements intact.     Conjunctiva/sclera:  Right eye: Right conjunctiva is not injected. No chemosis, exudate or hemorrhage.    Left eye: Left conjunctiva is injected. No chemosis, exudate or hemorrhage.    Pupils: Pupils are equal, round, and reactive to light.     Comments: Conjunctival injection, clear, watery drainage.  EOMs intact without difficulty or pain.  No surrounding periorbital erythema, warmth. No chemosis.  No fluorescein uptake  Negative Seidel sign  Cardiovascular:     Rate and Rhythm: Normal rate and regular rhythm.  Pulmonary:     Effort: Pulmonary effort is normal. No respiratory distress.  Abdominal:     General: There is no distension.     Palpations: Abdomen is soft.  Musculoskeletal:         General: Normal range of motion.     Cervical back: Normal range of motion and neck supple.  Skin:    General: Skin is warm and dry.  Neurological:     General: No focal deficit present.     Mental Status: He is alert and oriented to person, place, and time.    ED Results / Procedures / Treatments   Labs (all labs ordered are listed, but only abnormal results are displayed) Labs Reviewed  URINALYSIS, ROUTINE W REFLEX MICROSCOPIC  GC/CHLAMYDIA PROBE AMP ([place]) NOT AT [facility]    EKG None  Radiology No results found.  Procedures Procedures    Medications Ordered in ED Medications  cefTRIAXone (ROCEPHIN) injection 500 mg (500 mg Intramuscular Given 02/02/23 2255)  doxycycline (VIBRA-TABS) tablet 100 mg (100 mg Oral Given 02/02/23 2256)  fluorescein ophthalmic strip 1 strip (1 strip Left Eye Given 02/02/23 2250)  lidocaine (PF) (XYLOCAINE) 1 % injection (  Given 02/02/23 2255)   ED Course/ Medical Decision Making/ A&P    22 year old here for evaluation of 2 complaints.  3 days ago noted clear, watery drainage from his left eye.  Some pruritus.  Noted today he had some pain to the area.  No recent injury or trauma.  No blurred vision, diplopia.  No pain with eye movements.  No evidence of periorbital/ orbital cellulitis.  Does have some conjunctival injection without chemosis.  No hyphema.  Negative fluorescein uptake, negative Seidel sign.  No recent traumatic injuries to suggest globe rupture. No recent sick contacts.  Patient also with dysuria and penile discharge.  History of chlamydia.  No abdominal pain, pain with bowel movements, testicular pain, fever.  He is sexually active and does not use protection.  No rashes.  Discussed plan with patient.  Will plan on treating with Rocephin, Doxy.  Will also add on erythromycin eye ointment.  Will have him follow-up with ophthalmology for his eye complaints.  Encouraged abstaining from sexual intercourse until results of STD panels.   Overall his clear watery drainage does not seem suspicious for ocular gonorrhea or chlamydia however will cover with antibiotics and have close follow-up outpatient.  The patient has been appropriately medically screened and/or stabilized in the ED. I have low suspicion for any other emergent medical condition which would require further screening, evaluation or treatment in the ED or require inpatient management.  Patient is hemodynamically stable and in no acute distress.  Patient able to ambulate in department prior to ED.  Evaluation does not show acute pathology that would require ongoing or additional emergent interventions while in the emergency department or further inpatient treatment.  I have discussed the diagnosis with the patient and answered all questions.  Pain is been managed while  in the emergency department and patient has no further complaints prior to discharge.  Patient is comfortable with plan discussed in room and is stable for discharge at this time.  I have discussed strict return precautions for returning to the emergency department.  Patient was encouraged to follow-up with PCP/specialist refer to at discharge.                             Medical Decision Making Amount and/or Complexity of Data Reviewed External Data Reviewed: labs and notes. Labs: ordered. Decision-making details documented in ED Course.  Risk OTC drugs. Prescription drug management. Parenteral controlled substances. Decision regarding hospitalization. Diagnosis or treatment significantly limited by social determinants of health.         Final Clinical Impression(s) / ED Diagnoses Final diagnoses:  Dysuria  Eye drainage    Rx / DC Orders ED Discharge Orders          Ordered    doxycycline (VIBRAMYCIN) 100 MG capsule  2 times daily        02/02/23 2239    erythromycin ophthalmic ointment        02/02/23 2239              Jadrian Bulman A, PA-C 02/02/23 2307     Virgina Norfolk, DO 02/06/23 1512

## 2023-02-02 NOTE — ED Triage Notes (Signed)
Pain and redness in left eye +swelling Also reports pain from penis and noticed white drainage today

## 2023-02-04 LAB — GC/CHLAMYDIA PROBE AMP (~~LOC~~) NOT AT ARMC
Chlamydia: NEGATIVE
Comment: NEGATIVE
Comment: NORMAL
Neisseria Gonorrhea: POSITIVE — AB

## 2024-01-12 ENCOUNTER — Other Ambulatory Visit: Payer: Self-pay

## 2024-01-12 ENCOUNTER — Ambulatory Visit
Admission: EM | Admit: 2024-01-12 | Discharge: 2024-01-12 | Disposition: A | Discharge status: Home / Self Care | Payer: Self-pay | Attending: Family Medicine | Admitting: Family Medicine

## 2024-01-12 ENCOUNTER — Encounter: Payer: Self-pay | Admitting: *Deleted

## 2024-01-12 DIAGNOSIS — R300 Dysuria: Secondary | ICD-10-CM | POA: Insufficient documentation

## 2024-01-12 DIAGNOSIS — R360 Urethral discharge without blood: Secondary | ICD-10-CM | POA: Insufficient documentation

## 2024-01-12 DIAGNOSIS — Z202 Contact with and (suspected) exposure to infections with a predominantly sexual mode of transmission: Secondary | ICD-10-CM | POA: Insufficient documentation

## 2024-01-12 MED ORDER — CEFTRIAXONE SODIUM 500 MG IJ SOLR
500.0000 mg | Freq: Once | INTRAMUSCULAR | Status: AC
  Administered 2024-01-12: 500 mg via INTRAMUSCULAR

## 2024-01-12 NOTE — Discharge Instructions (Signed)
 Cytology results will be available within 24 hours.  Results will update to MyChart , if you have questions related to your results and treatment call the office directly if our clinical results nurse has not already reached out to you.

## 2024-01-12 NOTE — ED Provider Notes (Signed)
 [facility]    CSN: [id number] Arrival date & time: 01/12/24  1139      History   Chief Complaint Chief Complaint  Patient presents with   Penile Discharge    HPI Adam Espinoza is a 23 y.o. male.  Patient here today with acute onset dysuria and normal penile discharge.  Symptoms began early this morning.   He is sexually active with 1 partner who he did reach out to today to see if as experiencing any symptoms. Per patient his partner is getting tested today.   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 No pertinent surgical history.     Home Medications    Prior to Admission medications   Medication Sig Start Date End Date Taking? Authorizing Provider  DULoxetine (CYMBALTA) 20 MG capsule Take 20 mg by mouth daily. 12/11/18  Yes [provider]  lisdexamfetamine (VYVANSE) 30 MG capsule Take 30 mg by mouth daily. 02/08/13  Yes [provider]  acetaminophen (TYLENOL) 325 MG tablet Take 325-650 mg by mouth every 6 (six) hours as needed (for pain or headaches).    [provider]  COVID-19 At Home Antigen Test [facility] COVID-19 HOME TEST) KIT Use as Directed 05/25/21   Driscilla Grammes, [facility]  doxycycline (VIBRAMYCIN) 100 MG capsule Take 1 capsule (100 mg total) by mouth 2 (two) times daily. 02/02/23   Henderly, Britni A, PA-C  erythromycin ophthalmic ointment Place a 1/2 inch ribbon of ointment into the lower eyelid. 02/02/23   Henderly, Britni A, PA-C   HYDROcodone-acetaminophen (NORCO/VICODIN) 5-325 MG tablet Take 1 tablet by mouth every 6 (six) hours as needed for severe pain. 04/16/18   Zadie Rhine, MD  HYDROcodone-acetaminophen (NORCO/VICODIN) 5-325 MG tablet Take 1-2 tablets by mouth every 6 (six) hours as needed. 04/21/18   Lorre Nick, MD  ibuprofen (ADVIL,MOTRIN) 400 MG tablet Take 1 tablet (400 mg total) by mouth every 8 (eight) hours as needed for moderate pain. Patient not taking: Reported on 04/21/2018 04/16/18   Zadie Rhine, MD    Family History History reviewed. No pertinent family history.  Social History Social History   Tobacco Use   Smoking status: Never   Smokeless tobacco: Never  Vaping Use   Vaping status: Never Used  Substance Use Topics   Alcohol use: Yes    Comment: occasional   Drug use: No     Allergies   Nickel and Other   Review of Systems Review of Systems  Genitourinary:  Positive for penile discharge.     Physical Exam Triage Vital Signs ED Triage Vitals  Encounter Vitals Group     BP 01/12/24 1303 109/68     Systolic BP Percentile --      Diastolic BP Percentile --      Pulse Rate 01/12/24 1303 66     Resp 01/12/24 1303 16     Temp 01/12/24 1303 97.9 F (36.6 C)     Temp Source 01/12/24 1303 Oral     SpO2 01/12/24 1303 96 %     Weight --  Height --      Head Circumference --      Peak Flow --      Pain Score 01/12/24 1300 4     Pain Loc --      Pain Education --      Exclude from Growth Chart --    No data found.  Updated Vital Signs BP 109/68 (BP Location: Right Arm)   Pulse 66   Temp 97.9 F (36.6 C) (Oral)   Resp 16   SpO2 96%   Visual Acuity Right Eye Distance:   Left Eye Distance:   Bilateral Distance:    Right Eye Near:   Left Eye Near:    Bilateral Near:     Physical Exam Vitals reviewed.  Constitutional:      Appearance: Normal appearance.  HENT:     Head: Normocephalic and atraumatic.  Cardiovascular:     Rate and Rhythm: Normal  rate and regular rhythm.  Pulmonary:     Effort: Pulmonary effort is normal.     Breath sounds: Normal breath sounds.  Neurological:     General: No focal deficit present.     Mental Status: He is alert and oriented to person, place, and time.   Cytology swab self collected  UC Treatments / Results  Labs (all labs ordered are listed, but only abnormal results are displayed) Labs Reviewed  CYTOLOGY, (ORAL, ANAL, URETHRAL) ANCILLARY ONLY    EKG   Radiology No results found.  Procedures Procedures (including critical care time)  Medications Ordered in UC Medications  cefTRIAXone (ROCEPHIN) injection 500 mg (has no administration in time range)    Initial Impression / Assessment and Plan / UC Course  I have reviewed the triage vital signs and the nursing notes.  Pertinent labs & imaging results that were available during my care of the patient were reviewed by me and considered in my medical decision making (see chart for details).    Abnormal penile discharge without blood, high suspicion for STD covering for Rocephin 500 mg IM while cytology is pending.  Patient advised that even if his cytology results are negative and he learned that his partner has tested positive for any STIs he will need to for treatment.  Patient declined RPR and HIV testing as he reports he was tested somewhere else a month ago, all results were negative per patient.  Written education provided regarding safe sex practices. Pt advised result will be available within 24 hours.  Patient verbalized understanding and agreement with plan. Final Clinical Impressions(s) / UC Diagnoses   Final diagnoses:  Penile discharge, without blood  Dysuria  Encounter for assessment of STD exposure     Discharge Instructions      Cytology results will be available within 24 hours.  Results will update to MyChart , if you have questions related to your results and treatment call the office directly if our clinical  results nurse has not already reached out to you.   ED Prescriptions   None    PDMP not reviewed this encounter.   Bing Neighbors, NP 01/12/24 [phone]

## 2024-01-12 NOTE — ED Triage Notes (Signed)
 Pain with urination and penile discharge starting today

## 2024-01-13 LAB — CYTOLOGY, (ORAL, ANAL, URETHRAL) ANCILLARY ONLY
Chlamydia: NEGATIVE
Comment: NEGATIVE
Comment: NEGATIVE
Comment: NORMAL
Neisseria Gonorrhea: NEGATIVE
Trichomonas: NEGATIVE

## 2024-03-10 ENCOUNTER — Other Ambulatory Visit (HOSPITAL_COMMUNITY): Payer: Self-pay

## 2024-03-10 ENCOUNTER — Other Ambulatory Visit: Payer: Self-pay

## 2024-03-10 ENCOUNTER — Emergency Department (HOSPITAL_BASED_OUTPATIENT_CLINIC_OR_DEPARTMENT_OTHER)
Admission: EM | Admit: 2024-03-10 | Discharge: 2024-03-10 | Disposition: A | Discharge status: Home / Self Care | Payer: Self-pay | Attending: Emergency Medicine | Admitting: Emergency Medicine

## 2024-03-10 ENCOUNTER — Encounter (HOSPITAL_BASED_OUTPATIENT_CLINIC_OR_DEPARTMENT_OTHER): Payer: Self-pay | Admitting: Emergency Medicine

## 2024-03-10 DIAGNOSIS — R300 Dysuria: Secondary | ICD-10-CM | POA: Insufficient documentation

## 2024-03-10 DIAGNOSIS — J45909 Unspecified asthma, uncomplicated: Secondary | ICD-10-CM | POA: Insufficient documentation

## 2024-03-10 LAB — URINALYSIS, MICROSCOPIC (REFLEX)

## 2024-03-10 LAB — URINALYSIS, ROUTINE W REFLEX MICROSCOPIC
Bilirubin Urine: NEGATIVE
Glucose, UA: NEGATIVE mg/dL
Hgb urine dipstick: NEGATIVE
Ketones, ur: NEGATIVE mg/dL
Nitrite: NEGATIVE
Protein, ur: NEGATIVE mg/dL
Specific Gravity, Urine: 1.02 (ref 1.005–1.030)
pH: 7.5 (ref 5.0–8.0)

## 2024-03-10 MED ORDER — DOXYCYCLINE HYCLATE 100 MG PO TABS
100.0000 mg | ORAL_TABLET | Freq: Once | ORAL | Status: AC
  Administered 2024-03-10: 100 mg via ORAL
  Filled 2024-03-10: qty 1

## 2024-03-10 MED ORDER — DOXYCYCLINE HYCLATE 100 MG PO CAPS
100.0000 mg | ORAL_CAPSULE | Freq: Two times a day (BID) | ORAL | 0 refills | Status: AC
  Filled 2024-03-10: qty 14, 7d supply, fill #0

## 2024-03-10 MED ORDER — CEFTRIAXONE SODIUM 500 MG IJ SOLR
500.0000 mg | Freq: Once | INTRAMUSCULAR | Status: AC
Start: 2024-03-10 — End: 2024-03-10
  Administered 2024-03-10: 500 mg via INTRAMUSCULAR
  Filled 2024-03-10: qty 500

## 2024-03-10 NOTE — ED Triage Notes (Signed)
 Pt c/o dysuria and penile discharge.

## 2024-03-10 NOTE — ED Provider Notes (Signed)
 [place] [facility] Provider Note  CSN: [id number] Arrival date & time: 03/10/24 2130  Chief Complaint(s) Dysuria  HPI Adam Espinoza is a 23 y.o. male history of ADHD presenting to the emergency department with dysuria.  Reports dysuria for past few days as well as discharge from his urethra.  No testicular pain, abdominal pain, fevers or chills, back or flank pain.  Reports that he has similar episode few months ago, got a shot of antibiotics and then it went away.  Denies any new sexual partners.  Reports monogamy.   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 Authorizing Provider  acetaminophen  (TYLENOL ) 325 MG tablet Take 325-650 mg by mouth every 6 (six) hours as needed (for pain or headaches).    [provider]  COVID-19 At Home Antigen Test [facility] COVID-19 HOME TEST) KIT Use as Directed 05/25/21   Elora Hales, [facility]  doxycycline  (VIBRAMYCIN ) 100 MG capsule Take 1 capsule (100 mg total) by mouth 2 (two) times daily for 7 days. 03/10/24 03/17/24  Mordecai Applebaum, MD  DULoxetine (CYMBALTA) 20 MG capsule Take 20 mg by mouth daily. 12/11/18   [provider]  erythromycin  ophthalmic ointment Place a 1/2 inch ribbon of ointment into the lower eyelid. 02/02/23   Henderly, Britni A, PA-C  HYDROcodone -acetaminophen  (NORCO/VICODIN) 5-325 MG tablet Take 1 tablet by mouth every  6 (six) hours as needed for severe pain. 04/16/18   Eldon Greenland, MD  HYDROcodone -acetaminophen  (NORCO/VICODIN) 5-325 MG tablet Take 1-2 tablets by mouth every 6 (six) hours as needed. 04/21/18   Lind Repine, MD  ibuprofen  (ADVIL ,MOTRIN ) 400 MG tablet Take 1 tablet (400 mg total) by mouth every 8 (eight) hours as needed for moderate pain. Patient not taking: Reported on 04/21/2018 04/16/18   Eldon Greenland, MD  lisdexamfetamine (VYVANSE) 30 MG capsule Take 30 mg by mouth daily. 02/08/13   [provider]                                                                                                                                    Past Surgical History History reviewed. No pertinent surgical history. Family History History reviewed. No pertinent family history.  Social History Social History   Tobacco Use   Smoking status: Never   Smokeless tobacco: Never  Vaping Use   Vaping status: Never Used  Substance Use Topics   Alcohol use:  Yes    Comment: occasional   Drug use: No   Allergies Nickel and Other  Review of Systems Review of Systems  All other systems reviewed and are negative.   Physical Exam Vital Signs  I have reviewed the triage vital signs BP 133/78 (BP Location: Right Arm)   Pulse 80   Temp 98.3 F (36.8 C)   Resp 18   Ht 5\' 8"  (1.727 m)   Wt 90.7 kg   SpO2 99%   BMI 30.41 kg/m  Physical Exam Vitals and nursing note reviewed.  Constitutional:      General: He is not in acute distress.    Appearance: Normal appearance.  HENT:     Head: Normocephalic and atraumatic.     Mouth/Throat:     Mouth: Mucous membranes are moist.  Eyes:     Conjunctiva/sclera: Conjunctivae normal.  Cardiovascular:     Rate and Rhythm: Normal rate.  Pulmonary:     Effort: Pulmonary effort is normal. No respiratory distress.  Abdominal:     General: Abdomen is flat.  Skin:    General: Skin is warm and dry.     Capillary Refill: Capillary refill takes less  than 2 seconds.  Neurological:     General: No focal deficit present.     Mental Status: He is alert. Mental status is at baseline.  Psychiatric:        Mood and Affect: Mood normal.        Behavior: Behavior normal.     ED Results and Treatments Labs (all labs ordered are listed, but only abnormal results are displayed) Labs Reviewed  URINALYSIS, ROUTINE W REFLEX MICROSCOPIC - Abnormal; Notable for the following components:      Result Value   Leukocytes,Ua TRACE (*)    All other components within normal limits  URINALYSIS, MICROSCOPIC (REFLEX) - Abnormal; Notable for the following components:   Bacteria, UA RARE (*)    All other components within normal limits  URINE CULTURE  GC/CHLAMYDIA PROBE AMP ([place]) NOT AT [facility]                                                                                                                          Radiology No results found.  Pertinent labs & imaging results that were available during my care of the patient were reviewed by me and considered in my medical decision making (see MDM for details).  Medications Ordered in ED Medications  cefTRIAXone  (ROCEPHIN ) injection 500 mg (has no administration in time range)  doxycycline  (VIBRA -TABS) tablet 100 mg (has no administration in time range)  Procedures Procedures  (including critical care time)  Medical Decision Making / ED Course   MDM:  23 year old presenting to the emergency department of dysuria.  Patient overall well-appearing, vital signs reassuring.  Suspect most likely cause given age and gender is STD.  Will send gonorrhea and Chlamydia testing.  Urinalysis has some rare bacteria, WBCs, will send urine culture as well.  Denies any testicular pain to suggest any testicular pathology.  Will give dose of ceftriaxone  for empiric treatment  and sent prescription for doxycycline .  Discussed with lab and urine culture and GC testing will be added onto urine sample.      Additional history obtained:  -External records from outside source obtained and reviewed including: Chart review including previous notes, labs, imaging, consultation notes including prior visit    Lab Tests: -I ordered, reviewed, and interpreted labs.   The pertinent results include:   Labs Reviewed  URINALYSIS, ROUTINE W REFLEX MICROSCOPIC - Abnormal; Notable for the following components:      Result Value   Leukocytes,Ua TRACE (*)    All other components within normal limits  URINALYSIS, MICROSCOPIC (REFLEX) - Abnormal; Notable for the following components:   Bacteria, UA RARE (*)    All other components within normal limits  URINE CULTURE  GC/CHLAMYDIA PROBE AMP ([place]) NOT AT [facility]    Notable for bacturia    Medicines ordered and prescription drug management: Meds ordered this encounter  Medications   cefTRIAXone  (ROCEPHIN ) injection 500 mg    Antibiotic Indication::   UTI   doxycycline  (VIBRA -TABS) tablet 100 mg   doxycycline  (VIBRAMYCIN ) 100 MG capsule    Sig: Take 1 capsule (100 mg total) by mouth 2 (two) times daily for 7 days.    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    Final Clinical Impression(s) / ED Diagnoses Final diagnoses:  Dysuria     This chart was dictated using voice recognition software.  Despite best efforts to proofread,  errors can occur which can change the documentation meaning.    Mordecai Applebaum, MD 03/10/24 2209

## 2024-03-10 NOTE — Discharge Instructions (Addendum)
 We evaluated you for your urinary symptoms.  Your symptoms may be due to a urinary infection or a sexually transmitted disease.  We have sent a culture of your urine and testing for sexually transmitted diseases.  We have given you an antibiotic shot in the emergency department and we have sent a prescription for an antibiotic to go home with.  Since you do not have a primary care doctor, please schedule an appointment with a primary care doctor.  You can schedule an appointment with the [facility] health primary care clinic at [facility].  Please return if you have any new or worsening symptoms such as fevers, testicular pain, abdominal pain, vomiting, or any other new symptoms.

## 2024-03-11 ENCOUNTER — Other Ambulatory Visit (HOSPITAL_COMMUNITY): Payer: Self-pay

## 2024-03-11 LAB — GC/CHLAMYDIA PROBE AMP (~~LOC~~) NOT AT ARMC
Chlamydia: POSITIVE — AB
Comment: NEGATIVE
Comment: NORMAL
Neisseria Gonorrhea: POSITIVE — AB

## 2024-03-12 ENCOUNTER — Ambulatory Visit (HOSPITAL_COMMUNITY): Payer: Self-pay

## 2024-03-12 LAB — URINE CULTURE: Culture: <10000 — AB

## 2024-04-15 ENCOUNTER — Ambulatory Visit (HOSPITAL_COMMUNITY)
Admission: EM | Admit: 2024-04-15 | Discharge: 2024-04-15 | Disposition: A | Discharge status: Home / Self Care | Payer: Self-pay | Attending: Family Medicine | Admitting: Family Medicine

## 2024-04-15 ENCOUNTER — Other Ambulatory Visit (HOSPITAL_COMMUNITY): Payer: Self-pay

## 2024-04-15 ENCOUNTER — Other Ambulatory Visit: Payer: Self-pay

## 2024-04-15 ENCOUNTER — Encounter (HOSPITAL_COMMUNITY): Payer: Self-pay | Admitting: Emergency Medicine

## 2024-04-15 DIAGNOSIS — Z202 Contact with and (suspected) exposure to infections with a predominantly sexual mode of transmission: Secondary | ICD-10-CM | POA: Insufficient documentation

## 2024-04-15 DIAGNOSIS — N342 Other urethritis: Secondary | ICD-10-CM | POA: Insufficient documentation

## 2024-04-15 LAB — HIV ANTIBODY (ROUTINE TESTING W REFLEX): HIV Screen 4th Generation wRfx: NONREACTIVE

## 2024-04-15 MED ORDER — DOXYCYCLINE HYCLATE 100 MG PO CAPS
100.0000 mg | ORAL_CAPSULE | Freq: Two times a day (BID) | ORAL | 0 refills | Status: AC
  Filled 2024-04-15: qty 14, 7d supply, fill #0

## 2024-04-15 MED ORDER — CEFTRIAXONE SODIUM 500 MG IJ SOLR
INTRAMUSCULAR | Status: AC
  Filled 2024-04-15: qty 500

## 2024-04-15 MED ORDER — LIDOCAINE HCL (PF) 1 % IJ SOLN
INTRAMUSCULAR | Status: AC
  Filled 2024-04-15: qty 2

## 2024-04-15 MED ORDER — CEFTRIAXONE SODIUM 500 MG IJ SOLR
500.0000 mg | INTRAMUSCULAR | Status: DC
  Administered 2024-04-15: 500 mg via INTRAMUSCULAR

## 2024-04-15 NOTE — ED Provider Notes (Signed)
 [facility]    CSN: [id number] Arrival date & time: 04/15/24  9178      History   Chief Complaint Chief Complaint  Patient presents with   Dysuria    HPI Adam Espinoza is a 23 y.o. male.    Dysuria Presenting symptoms: dysuria   Here for dysuria and penile discharge and a little bit of itching.  Symptoms began yesterday.  No fever and no vomiting.  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 No pertinent surgical history.     Home Medications    Prior to Admission medications   Medication Sig Start Date End Date Taking? Authorizing Provider  doxycycline  (VIBRAMYCIN ) 100 MG capsule Take 1 capsule (100 mg total) by mouth 2 (two) times daily for 7 days. 04/15/24 04/22/24 Yes Vonna Sharlet POUR, MD  acetaminophen  (TYLENOL ) 325 MG tablet Take 325-650 mg by mouth every 6 (six) hours as needed (for pain or headaches).    [provider]  COVID-19 At Home Antigen Test [facility] COVID-19 HOME TEST) KIT Use as Directed 05/25/21   Deane Garnette SAUNDERS, [facility]    Family History History reviewed. No pertinent family history.  Social History Social History   Tobacco Use   Smoking status: Never   Smokeless tobacco: Never  Vaping Use   Vaping status: Never Used  Substance Use Topics   Alcohol use: Yes    Comment: occasional   Drug use: Yes    Types: Marijuana     Allergies   Nickel and Other   Review of Systems Review of Systems  Genitourinary:  Positive for dysuria.      Physical Exam Triage Vital Signs ED Triage Vitals  Encounter Vitals Group     BP 04/15/24 0833 124/75     Girls Systolic BP Percentile --      Girls Diastolic BP Percentile --      Boys Systolic BP Percentile --      Boys Diastolic BP Percentile --      Pulse Rate 04/15/24 0833 80     Resp 04/15/24 0833 18     Temp 04/15/24 0833 97.7 F (36.5 C)     Temp Source 04/15/24 0833 Oral     SpO2 04/15/24 0833 98 %     Weight --      Height --      Head Circumference --      Peak Flow --      Pain Score 04/15/24 0831 8     Pain Loc --      Pain Education --      Exclude from Growth Chart --    No data found.  Updated Vital Signs BP 124/75 (BP Location: Left Arm)   Pulse 80   Temp 97.7 F (36.5 C) (Oral)   Resp 18   SpO2 98%   Visual Acuity Right Eye Distance:   Left Eye Distance:   Bilateral Distance:    Right Eye Near:   Left Eye Near:    Bilateral Near:     Physical  Exam Vitals reviewed.  Constitutional:      General: He is not in acute distress.    Appearance: He is not ill-appearing, toxic-appearing or diaphoretic.  HENT:     Mouth/Throat:     Mouth: Mucous membranes are moist.   Cardiovascular:     Rate and Rhythm: Normal rate and regular rhythm.     Heart sounds: No murmur heard. Pulmonary:     Effort: Pulmonary effort is normal.     Breath sounds: Normal breath sounds.   Musculoskeletal:     Cervical back: Neck supple.  Lymphadenopathy:     Cervical: No cervical adenopathy.   Skin:    Coloration: Skin is not pale.   Neurological:     Mental Status: He is alert and oriented to person, place, and time.   Psychiatric:        Behavior: Behavior normal.      UC Treatments / Results  Labs (all labs ordered are listed, but only abnormal results are displayed) Labs Reviewed  RPR  HIV ANTIBODY (ROUTINE TESTING W REFLEX)  CYTOLOGY, (ORAL, ANAL, URETHRAL) ANCILLARY ONLY    EKG   Radiology No results found.  Procedures Procedures  (including critical care time)  Medications Ordered in UC Medications  cefTRIAXone  (ROCEPHIN ) injection 500 mg (has no administration in time range)    Initial Impression / Assessment and Plan / UC Course  I have reviewed the triage vital signs and the nursing notes.  Pertinent labs & imaging results that were available during my care of the patient were reviewed by me and considered in my medical decision making (see chart for details).     Urethral self swab is done and staff will notify him of any positives and treat per protocol.  Rocephin  is given here for potential gonorrhea, and doxycycline  is sent to treat empirically for chlamydia. Blood is drawn to check HIV and RPR, and we will also notify him if any of that is positive Final Clinical Impressions(s) / UC Diagnoses   Final diagnoses:  Urethritis  Exposure to STD     Discharge Instructions      Staff will notify you if there is anything positive on the swab or on the blood work.  You have been given a shot of ceftriaxone  500 mg  Take doxycycline  100 mg --1 capsule 2 times daily for 7 days      ED Prescriptions     Medication Sig Dispense Auth. Provider   doxycycline  (VIBRAMYCIN ) 100 MG capsule Take 1 capsule (100 mg total) by mouth 2 (two) times daily for 7 days. 14 capsule Vonna, [place] Fritze K, MD      PDMP not reviewed this encounter.   Vonna Sharlet POUR, MD 04/15/24 [phone]

## 2024-04-15 NOTE — ED Triage Notes (Signed)
 Reports painful urination yesterday and penile discharge noted today

## 2024-04-15 NOTE — Discharge Instructions (Addendum)
Staff will notify you if there is anything positive on the swab or on the blood work.  You have been given a shot of ceftriaxone 500 mg  Take doxycycline 100 mg --1 capsule 2 times daily for 7 days

## 2024-04-16 ENCOUNTER — Ambulatory Visit (HOSPITAL_COMMUNITY): Payer: Self-pay

## 2024-04-16 LAB — CYTOLOGY, (ORAL, ANAL, URETHRAL) ANCILLARY ONLY
Chlamydia: NEGATIVE
Comment: NEGATIVE
Comment: NEGATIVE
Comment: NORMAL
Neisseria Gonorrhea: POSITIVE — AB
Trichomonas: NEGATIVE

## 2024-04-16 LAB — RPR: RPR Ser Ql: NONREACTIVE

## 2024-04-28 ENCOUNTER — Encounter: Payer: Self-pay | Admitting: Emergency Medicine

## 2024-04-28 ENCOUNTER — Ambulatory Visit
Admission: EM | Admit: 2024-04-28 | Discharge: 2024-04-28 | Disposition: A | Discharge status: Home / Self Care | Payer: Self-pay | Attending: Physician Assistant | Admitting: Physician Assistant

## 2024-04-28 DIAGNOSIS — Z113 Encounter for screening for infections with a predominantly sexual mode of transmission: Secondary | ICD-10-CM | POA: Insufficient documentation

## 2024-04-28 NOTE — ED Provider Notes (Signed)
 [facility]    CSN: [id number] Arrival date & time: 04/28/24  1157      History   Chief Complaint Chief Complaint  Patient presents with   Penis problem    HPI Adam Espinoza is a 23 y.o. male.   Patient here today for evaluation of tingling to his penis.  He denies any pain or dysuria.  He was recently treated for STDs and notes that penile discharge resolved after treatment.  He denies any new sexual partners or concerns for reexposure to STDs.  He would like retesting of STDs if possible.  The history is provided by the patient.    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 No pertinent surgical history.     Home Medications    Prior to Admission medications   Medication Sig Start Date End Date Taking? Authorizing Provider  acetaminophen  (TYLENOL ) 325 MG tablet Take 325-650 mg by mouth every 6 (six) hours as needed (for pain or headaches).    [provider]  COVID-19 At Home Antigen Test [facility] COVID-19 HOME TEST) KIT Use as Directed 05/25/21   Deane Garnette SAUNDERS, [facility]    Family History History reviewed. No pertinent family history.  Social History Social History   Tobacco Use   Smoking status: Never   Smokeless tobacco: Never  Vaping Use   Vaping status: Never Used  Substance Use Topics   Alcohol use: Yes    Comment: occasional   Drug use: Yes    Types: Marijuana     Allergies   Nickel and Other   Review of Systems Review of Systems  Constitutional:  Negative  for chills and fever.  Eyes:  Negative for discharge and redness.  Respiratory:  Negative for shortness of breath.   Genitourinary:  Negative for dysuria, genital sores, penile discharge and penile pain.  Neurological:  Negative for numbness.     Physical Exam Triage Vital Signs ED Triage Vitals  Encounter Vitals Group     BP      Girls Systolic BP Percentile      Girls Diastolic BP Percentile      Boys Systolic BP Percentile      Boys Diastolic BP Percentile      Pulse      Resp      Temp      Temp src      SpO2      Weight      Height      Head Circumference      Peak Flow      Pain Score      Pain Loc      Pain Education      Exclude from Growth Chart    No data found.  Updated Vital Signs BP 113/64 (BP Location: Right Arm)   Pulse 64   Temp 97.9 F (36.6 C) (Oral)   Resp 16   SpO2 97%   Visual Acuity Right Eye Distance:   Left Eye Distance:   Bilateral Distance:    Right Eye Near:  Left Eye Near:    Bilateral Near:     Physical Exam Vitals and nursing note reviewed.  Constitutional:      General: He is not in acute distress.    Appearance: Normal appearance. He is not ill-appearing.  HENT:     Head: Normocephalic and atraumatic.  Eyes:     Conjunctiva/sclera: Conjunctivae normal.  Cardiovascular:     Rate and Rhythm: Normal rate.  Pulmonary:     Effort: Pulmonary effort is normal. No respiratory distress.  Neurological:     Mental Status: He is alert.  Psychiatric:        Mood and Affect: Mood normal.        Behavior: Behavior normal.        Thought Content: Thought content normal.      UC Treatments / Results  Labs (all labs ordered are listed, but only abnormal results are displayed) Labs Reviewed  CYTOLOGY, (ORAL, ANAL, URETHRAL) ANCILLARY ONLY    EKG   Radiology No results found.  Procedures Procedures (including critical care time)  Medications Ordered in UC Medications - No data to display  Initial Impression /  Assessment and Plan / UC Course  I have reviewed the triage vital signs and the nursing notes.  Pertinent labs & imaging results that were available during my care of the patient were reviewed by me and considered in my medical decision making (see chart for details).    STD screening repeated at patient's request.  Will await results for further recommendation.  Encouraged follow-up with any concerns in the meantime.  Final Clinical Impressions(s) / UC Diagnoses   Final diagnoses:  Screening for STD (sexually transmitted disease)   Discharge Instructions   None    ED Prescriptions   None    PDMP not reviewed this encounter.   Billy Asberry FALCON, PA-C 04/28/24 1241

## 2024-04-28 NOTE — ED Triage Notes (Signed)
 Pt st's he was recently treated for STD's st's at that time he was having a discharge  Pt st's discharge has subsided but continues to have tingling in his penis

## 2024-04-29 LAB — CYTOLOGY, (ORAL, ANAL, URETHRAL) ANCILLARY ONLY
Chlamydia: NEGATIVE
Comment: NEGATIVE
Comment: NEGATIVE
Comment: NORMAL
Neisseria Gonorrhea: NEGATIVE
Trichomonas: NEGATIVE

## 2024-07-27 ENCOUNTER — Encounter (HOSPITAL_COMMUNITY): Payer: Self-pay | Admitting: Emergency Medicine

## 2024-07-27 ENCOUNTER — Emergency Department (HOSPITAL_COMMUNITY)
Admission: EM | Admit: 2024-07-27 | Discharge: 2024-07-28 | Disposition: A | Discharge status: Home / Self Care | Attending: Emergency Medicine | Admitting: Emergency Medicine

## 2024-07-27 ENCOUNTER — Other Ambulatory Visit: Payer: Self-pay

## 2024-07-27 DIAGNOSIS — J45909 Unspecified asthma, uncomplicated: Secondary | ICD-10-CM | POA: Diagnosis not present

## 2024-07-27 DIAGNOSIS — R369 Urethral discharge, unspecified: Secondary | ICD-10-CM | POA: Insufficient documentation

## 2024-07-27 DIAGNOSIS — R300 Dysuria: Secondary | ICD-10-CM | POA: Diagnosis not present

## 2024-07-27 DIAGNOSIS — Z711 Person with feared health complaint in whom no diagnosis is made: Secondary | ICD-10-CM

## 2024-07-27 NOTE — ED Triage Notes (Signed)
 Patient c/o penile discharge. Patient report unprotected sex few days ago. Patient denies N/V. Patient denies fever.

## 2024-07-28 LAB — URINALYSIS, ROUTINE W REFLEX MICROSCOPIC
Bilirubin Urine: NEGATIVE
Glucose, UA: NEGATIVE mg/dL
Hgb urine dipstick: NEGATIVE
Ketones, ur: NEGATIVE mg/dL
Nitrite: NEGATIVE
Protein, ur: NEGATIVE mg/dL
Specific Gravity, Urine: 1.023 (ref 1.005–1.030)
WBC, UA: >50 WBC/hpf (ref 0–5)
pH: 7 (ref 5.0–8.0)

## 2024-07-28 MED ORDER — DOXYCYCLINE HYCLATE 100 MG PO CAPS: 100.0000 mg | ORAL_CAPSULE | Freq: Two times a day (BID) | ORAL | 0 refills | Status: DC

## 2024-07-28 MED ORDER — DOXYCYCLINE HYCLATE 100 MG PO TABS
100.0000 mg | ORAL_TABLET | Freq: Once | ORAL | Status: AC
  Administered 2024-07-28: 100 mg via ORAL
  Filled 2024-07-28: qty 1

## 2024-07-28 MED ORDER — STERILE WATER FOR INJECTION IJ SOLN
INTRAMUSCULAR | Status: AC
  Administered 2024-07-28: 10 mL
  Filled 2024-07-28: qty 10

## 2024-07-28 MED ORDER — CEFTRIAXONE SODIUM 1 G IJ SOLR
500.0000 mg | Freq: Once | INTRAMUSCULAR | Status: AC
  Administered 2024-07-28: 500 mg via INTRAMUSCULAR
  Filled 2024-07-28: qty 10

## 2024-07-28 NOTE — ED Provider Notes (Signed)
 [place] [facility] AT [facility] Provider Note   CSN: [id number] Arrival date & time: 07/27/24  2337     Patient presents with: Penile Discharge   Adam Espinoza is a 23 y.o. male.   HPI     This is a 23 year old male who presents with concern for STD.  Patient reports that he is having dysuria and penile discharge.  Has a history of gonorrhea with similar symptoms in the past.  Did have a sexual encounter several days ago that was unprotected.  He is unsure whether this partner had any symptoms.  Denies any systemic complaints.  Prior to Admission medications   Medication Sig Start Date End Date Taking? Authorizing Provider  doxycycline  (VIBRAMYCIN ) 100 MG capsule Take 1 capsule (100 mg total) by mouth 2 (two) times daily. 07/28/24  Yes Haydon Dorris, Charmaine FALCON, MD  acetaminophen  (TYLENOL ) 325 MG tablet Take 325-650 mg by mouth every 6 (six) hours as needed (for pain or headaches).    [provider]  COVID-19 At Home Antigen Test [facility] COVID-19 HOME TEST) KIT Use as Directed 05/25/21   Deane Garnette SAUNDERS, [facility]    Allergies: Nickel and Other    Review of Systems  Constitutional:  Negative for fever.  Respiratory:  Negative for shortness of breath.   Genitourinary:  Positive for dysuria and penile pain.  All other systems reviewed and are negative.   Updated Vital Signs BP (!) 145/82   Pulse 80   Temp 98 F (36.7 C)   Resp 16   SpO2 100%   Physical Exam Vitals and nursing note reviewed.  Constitutional:      Appearance: He is well-developed. He is not ill-appearing.  HENT:     Head: Normocephalic and atraumatic.  Eyes:     Pupils: Pupils are equal, round, and reactive to light.  Cardiovascular:     Rate and Rhythm: Normal rate and regular rhythm.     Heart sounds: Normal heart sounds. No murmur heard. Pulmonary:     Effort: Pulmonary effort is normal. No respiratory distress.     Breath sounds: Normal breath sounds. No  wheezing.  Abdominal:     Palpations: Abdomen is soft.  Musculoskeletal:     Cervical back: Neck supple.  Lymphadenopathy:     Cervical: No cervical adenopathy.  Skin:    General: Skin is warm and dry.  Neurological:     Mental Status: He is alert and oriented to person, place, and time.  Psychiatric:        Mood and Affect: Mood normal.     (all labs ordered are listed, but only abnormal results are displayed) Labs Reviewed  URINALYSIS, ROUTINE W REFLEX MICROSCOPIC - Abnormal; Notable for the following components:      Result Value   APPearance HAZY (*)    Leukocytes,Ua SMALL (*)    Bacteria, UA MANY (*)    All other components within normal limits  GC/CHLAMYDIA PROBE AMP () NOT AT [facility]    EKG: None  Radiology: No results found.   Procedures   Medications Ordered in the ED  cefTRIAXone  (ROCEPHIN ) injection 500 mg (has no administration in time range)  doxycycline  (VIBRA -TABS) tablet 100 mg (has no administration in time range)                                    Medical Decision Making Amount and/or Complexity  of Data Reviewed Labs: ordered.  Risk Prescription drug management.   This patient presents to the ED for concern of STD, this involves an extensive number of treatment options, and is a complaint that carries with it a high risk of complications and morbidity.  I considered the following differential and admission for this acute, potentially life threatening condition.  The differential diagnosis includes STD, UTI  MDM:    This is a 23 year old male who presents with dysuria.  Recent unprotected sexual encounter.  Symptoms highly concerning for STD in this young otherwise healthy male.  Urinalysis shows greater than 50 white cells and many bacteria.  Patient given IM Rocephin  and doxycycline .  Will treat with doxycycline  for the next 10 days.  We discussed safe sex practices and abstinence for the next 10 days while also getting partners  tested and treated.  (Labs, imaging, consults)  Labs: I Ordered, and personally interpreted labs.  The pertinent results include: GC, chlamydia, urinalysis  Imaging Studies ordered: I ordered imaging studies including none I independently visualized and interpreted imaging. I agree with the radiologist interpretation  Additional history obtained from chart review.  External records from outside source obtained and reviewed including prior evaluations  Cardiac Monitoring: The patient was not maintained on a cardiac monitor.  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    Dispense:  20 capsule    Refill:  0    I have reviewed the patients home medicines and have made adjustments as needed  Problem List / ED Course: Problem List Items Addressed This Visit   None Visit Diagnoses       Dysuria    -  Primary     Concern about STD in male without diagnosis                    Final diagnoses:  Dysuria  Concern about STD in male without diagnosis    ED Discharge Orders          Ordered    doxycycline  (VIBRAMYCIN ) 100 MG capsule  2 times daily        07/28/24 0118                Rollande Thursby, Charmaine FALCON, MD 07/28/24 0122

## 2024-07-28 NOTE — Discharge Instructions (Signed)
 You were seen today for concerns for an STD.  Continue antibiotics for the next 10 days.  You should abstain from sexual activity during this time.  If anything comes positive, you will be given a phone call.  You should always use condoms to protect against STDs.

## 2024-07-29 LAB — GC/CHLAMYDIA PROBE AMP (~~LOC~~) NOT AT ARMC
Chlamydia: POSITIVE — AB
Comment: NEGATIVE
Comment: NORMAL
Neisseria Gonorrhea: NEGATIVE

## 2024-07-30 ENCOUNTER — Ambulatory Visit (HOSPITAL_COMMUNITY): Payer: Self-pay

## 2024-09-14 ENCOUNTER — Emergency Department (HOSPITAL_COMMUNITY): Admission: EM | Admit: 2024-09-14 | Discharge: 2024-09-14 | Disposition: A | Discharge status: Home / Self Care

## 2024-09-14 ENCOUNTER — Other Ambulatory Visit: Payer: Self-pay

## 2024-09-14 DIAGNOSIS — Z202 Contact with and (suspected) exposure to infections with a predominantly sexual mode of transmission: Secondary | ICD-10-CM

## 2024-09-14 DIAGNOSIS — R300 Dysuria: Secondary | ICD-10-CM | POA: Insufficient documentation

## 2024-09-14 DIAGNOSIS — R369 Urethral discharge, unspecified: Secondary | ICD-10-CM | POA: Diagnosis present

## 2024-09-14 LAB — URINALYSIS, ROUTINE W REFLEX MICROSCOPIC
Bilirubin Urine: NEGATIVE
Glucose, UA: NEGATIVE mg/dL
Hgb urine dipstick: NEGATIVE
Ketones, ur: NEGATIVE mg/dL
Leukocytes,Ua: NEGATIVE
Nitrite: NEGATIVE
Protein, ur: NEGATIVE mg/dL
Specific Gravity, Urine: 1.019 (ref 1.005–1.030)
pH: 6 (ref 5.0–8.0)

## 2024-09-14 MED ORDER — CEFTRIAXONE SODIUM 500 MG IJ SOLR
500.0000 mg | Freq: Once | INTRAMUSCULAR | Status: AC
  Administered 2024-09-14: 500 mg via INTRAMUSCULAR
  Filled 2024-09-14: qty 500

## 2024-09-14 MED ORDER — DOXYCYCLINE HYCLATE 100 MG PO CAPS: 100.0000 mg | ORAL_CAPSULE | Freq: Two times a day (BID) | ORAL | 0 refills | Status: AC

## 2024-09-14 MED ORDER — LIDOCAINE HCL (PF) 1 % IJ SOLN
1.0000 mL | Freq: Once | INTRAMUSCULAR | Status: AC
  Administered 2024-09-14: 2 mL
  Filled 2024-09-14: qty 5

## 2024-09-14 NOTE — ED Triage Notes (Signed)
 Pt. Stated, Im having penis discharge and I have to make myself urinate. This started 3 days go. I was treated for STD in October.  Pt has had unprotective sex.

## 2024-09-14 NOTE — Discharge Instructions (Addendum)
 Please avoid from sexual activity until you have completed full course of antibiotics.  Please take all of the antibiotics even if symptoms improve.  You may follow-up the results of testing on your MyChart.  Your partners will also need to get treated.  Return for fevers, chills, lethargy, severe pain or any new or worsening symptoms that are concerning to you.

## 2024-09-14 NOTE — ED Provider Notes (Signed)
 [place] EMERGENCY DEPARTMENT AT [facility] Provider Note   CSN: [id number] Arrival date & time: 09/14/24  9165     Patient presents with: SEXUALLY TRANSMITTED DISEASE, Penile Discharge, Dysuria, and Urinary Retention   Adam Espinoza is a 23 y.o. male.   23 year old male presenting emergency department with concern for STI.  Had STI last month with similar symptoms.  Complaining of penile discharge, hesitancy starting stream with some dysuria.  No hematuria.  No fevers or chills.  Denies penile lesions.  Reports same sexual partner who was also treated   Penile Discharge  Dysuria Presenting symptoms: dysuria and penile discharge        Prior to Admission medications   Medication Sig Start Date End Date Taking? Authorizing Provider  acetaminophen  (TYLENOL ) 325 MG tablet Take 325-650 mg by mouth every 6 (six) hours as needed (for pain or headaches).    [provider]  COVID-19 At Home Antigen Test [facility] COVID-19 HOME TEST) KIT Use as Directed 05/25/21   Deane Garnette SAUNDERS, [facility]  doxycycline  (VIBRAMYCIN ) 100 MG capsule Take 1 capsule (100 mg total) by mouth 2 (two) times daily. 07/28/24   Horton, Charmaine FALCON, MD    Allergies: Nickel and Other    Review of Systems  Genitourinary:  Positive for dysuria and penile discharge.    Updated Vital Signs BP (!) 139/113 (BP Location: Right Arm)   Pulse 78   Temp 98.9 F (37.2 C) (Oral)   Resp 17   Ht 5' 8 (1.727 m)   Wt 93 kg   SpO2 97%   BMI 31.17 kg/m   Physical Exam Vitals and nursing note reviewed.  Constitutional:      General: He is not in acute distress.    Appearance: He is not toxic-appearing.  HENT:     Head: Normocephalic.     Nose: Nose normal.     Mouth/Throat:     Mouth: Mucous membranes are moist.  Cardiovascular:     Rate and Rhythm: Normal rate.  Pulmonary:     Effort: Pulmonary effort is normal.  Genitourinary:    Comments: Patient deferred exam Skin:    General:  Skin is warm.     Capillary Refill: Capillary refill takes less than 2 seconds.  Neurological:     Mental Status: He is alert and oriented to person, place, and time.  Psychiatric:        Mood and Affect: Mood normal.        Behavior: Behavior normal.     (all labs ordered are listed, but only abnormal results are displayed) Labs Reviewed  URINALYSIS, ROUTINE W REFLEX MICROSCOPIC  GC/CHLAMYDIA PROBE AMP ([place]) NOT AT [facility]    EKG: None  Radiology: No results found.   Procedures   Medications Ordered in the ED  cefTRIAXone  (ROCEPHIN ) injection 500 mg (has no administration in time range)  lidocaine  (PF) (XYLOCAINE ) 1 % injection 1-2.1 mL (has no administration in time range)                                    Medical Decision Making 23 year old presenting emergency department with concern for sexually-transmitted infection.  Vital signs reassuring.  Per chart review has been treated for gonorrhea and chlamydia in the past.  His urine today not indicative of acute UTI causing his symptoms.  Adding on GC chlamydia.  Patient is requesting empiric treatment which  I feel is reasonable given his STI history.  Given dose of Rocephin  and doxycycline .  Encouraged him to practice safer sex practices as well as treatment for sexual partner.  Amount and/or Complexity of Data Reviewed Labs: ordered.  Risk Prescription drug management.       Final diagnoses:  None    ED Discharge Orders     None          Neysa Caron PARAS, [place] 09/14/24 9047

## 2024-09-15 LAB — GC/CHLAMYDIA PROBE AMP (~~LOC~~) NOT AT ARMC
Chlamydia: NEGATIVE
Comment: NEGATIVE
Comment: NORMAL
Neisseria Gonorrhea: NEGATIVE

## 2024-11-22 ENCOUNTER — Ambulatory Visit (HOSPITAL_COMMUNITY)
Admission: EM | Admit: 2024-11-22 | Discharge: 2024-11-22 | Disposition: A | Discharge status: Home / Self Care | Source: Home / Self Care

## 2024-11-22 ENCOUNTER — Encounter (HOSPITAL_COMMUNITY): Payer: Self-pay

## 2024-11-22 ENCOUNTER — Other Ambulatory Visit: Payer: Self-pay

## 2024-11-22 DIAGNOSIS — R300 Dysuria: Secondary | ICD-10-CM | POA: Diagnosis not present

## 2024-11-22 DIAGNOSIS — Z113 Encounter for screening for infections with a predominantly sexual mode of transmission: Secondary | ICD-10-CM | POA: Diagnosis not present

## 2024-11-22 LAB — POCT URINE DIPSTICK
Bilirubin, UA: NEGATIVE
Blood, UA: NEGATIVE
Glucose, UA: NEGATIVE mg/dL
Ketones, POC UA: NEGATIVE mg/dL
Leukocytes, UA: NEGATIVE
Nitrite, UA: NEGATIVE
POC PROTEIN,UA: NEGATIVE
Spec Grav, UA: 1.02
Urobilinogen, UA: 0.2 U/dL
pH, UA: 7.5

## 2024-11-22 NOTE — ED Triage Notes (Signed)
 Pt presents with c/o penile discharge and dysuria. This is day three of symptoms. States the last time he was experiencing similar sxs, tested positive for Chlamydia. No pain in triage. Only pain voiced with/directly after urination.

## 2024-11-22 NOTE — Discharge Instructions (Addendum)
 Your urinalysis does not show signs of infection.    STD testing pending, this will take 2-3 days to result. We will only call you if your testing is positive for any infection(s) and we will provide treatment.  Avoid sexual intercourse until your STD results come back.  If any of your STD results are positive, you will need to avoid sexual intercourse for 7 days while you are being treated to prevent spread of STD.  Condom use is the best way to prevent spread of STDs. Notify partner(s) of any positive results.  Return to urgent care as needed.

## 2024-11-23 LAB — CYTOLOGY, (ORAL, ANAL, URETHRAL) ANCILLARY ONLY
Chlamydia: NEGATIVE
Comment: NEGATIVE
Comment: NEGATIVE
Comment: NORMAL
Neisseria Gonorrhea: NEGATIVE
Trichomonas: NEGATIVE

## 2024-11-25 ENCOUNTER — Ambulatory Visit
